# Patient Record
Sex: Female | Born: 1955
Health system: Southern US, Community
[De-identification: ages and names within clinical notes are randomized; demographics above are authoritative.]

## PROBLEM LIST (undated history)

## (undated) DIAGNOSIS — H409 Unspecified glaucoma: Secondary | ICD-10-CM

## (undated) DIAGNOSIS — F419 Anxiety disorder, unspecified: Secondary | ICD-10-CM

---

## 1998-04-29 ENCOUNTER — Other Ambulatory Visit: Admission: RE | Admit: 1998-04-29 | Discharge: 1998-04-29 | Payer: Self-pay | Admitting: Orthopedic Surgery

## 1999-09-29 ENCOUNTER — Other Ambulatory Visit: Admission: RE | Admit: 1999-09-29 | Discharge: 1999-09-29 | Payer: Self-pay | Admitting: Family Medicine

## 2000-09-26 ENCOUNTER — Other Ambulatory Visit: Admission: RE | Admit: 2000-09-26 | Discharge: 2000-09-26 | Payer: Self-pay | Admitting: Family Medicine

## 2001-12-22 ENCOUNTER — Other Ambulatory Visit: Admission: RE | Admit: 2001-12-22 | Discharge: 2001-12-22 | Payer: Self-pay | Admitting: Family Medicine

## 2003-01-05 ENCOUNTER — Other Ambulatory Visit: Admission: RE | Admit: 2003-01-05 | Discharge: 2003-01-05 | Payer: Self-pay | Admitting: Family Medicine

## 2004-02-04 ENCOUNTER — Other Ambulatory Visit: Admission: RE | Admit: 2004-02-04 | Discharge: 2004-02-04 | Payer: Self-pay | Admitting: Family Medicine

## 2005-02-09 ENCOUNTER — Other Ambulatory Visit: Admission: RE | Admit: 2005-02-09 | Discharge: 2005-02-09 | Payer: Self-pay | Admitting: Family Medicine

## 2013-08-14 ENCOUNTER — Other Ambulatory Visit: Payer: Self-pay | Admitting: Occupational Medicine

## 2013-08-14 ENCOUNTER — Ambulatory Visit: Payer: Self-pay

## 2013-08-14 DIAGNOSIS — R52 Pain, unspecified: Secondary | ICD-10-CM

## 2014-03-08 ENCOUNTER — Encounter (HOSPITAL_COMMUNITY): Payer: Self-pay | Admitting: Emergency Medicine

## 2014-03-08 ENCOUNTER — Emergency Department (HOSPITAL_COMMUNITY)
Admission: EM | Admit: 2014-03-08 | Discharge: 2014-03-08 | Disposition: A | Discharge status: Home / Self Care | Payer: Managed Care, Other (non HMO) | Attending: Emergency Medicine | Admitting: Emergency Medicine

## 2014-03-08 ENCOUNTER — Emergency Department (HOSPITAL_COMMUNITY): Payer: Managed Care, Other (non HMO)

## 2014-03-08 DIAGNOSIS — S1093XA Contusion of unspecified part of neck, initial encounter: Principal | ICD-10-CM

## 2014-03-08 DIAGNOSIS — S0083XA Contusion of other part of head, initial encounter: Principal | ICD-10-CM | POA: Insufficient documentation

## 2014-03-08 DIAGNOSIS — S0033XA Contusion of nose, initial encounter: Secondary | ICD-10-CM

## 2014-03-08 DIAGNOSIS — S0003XA Contusion of scalp, initial encounter: Secondary | ICD-10-CM | POA: Insufficient documentation

## 2014-03-08 DIAGNOSIS — Y92410 Unspecified street and highway as the place of occurrence of the external cause: Secondary | ICD-10-CM | POA: Insufficient documentation

## 2014-03-08 DIAGNOSIS — Y9389 Activity, other specified: Secondary | ICD-10-CM | POA: Insufficient documentation

## 2014-03-08 DIAGNOSIS — Z79899 Other long term (current) drug therapy: Secondary | ICD-10-CM | POA: Insufficient documentation

## 2014-03-08 DIAGNOSIS — S0990XA Unspecified injury of head, initial encounter: Secondary | ICD-10-CM | POA: Insufficient documentation

## 2014-03-08 DIAGNOSIS — S20219A Contusion of unspecified front wall of thorax, initial encounter: Secondary | ICD-10-CM | POA: Insufficient documentation

## 2014-03-08 MED ORDER — HYDROCODONE-ACETAMINOPHEN 5-325 MG PO TABS: ORAL_TABLET | ORAL | Status: DC

## 2014-03-08 MED ORDER — IBUPROFEN 200 MG PO TABS
600.0000 mg | ORAL_TABLET | Freq: Once | ORAL | Status: AC
  Administered 2014-03-08: 600 mg via ORAL
  Filled 2014-03-08: qty 3

## 2014-03-08 MED ORDER — METHOCARBAMOL 500 MG PO TABS: 1000.0000 mg | ORAL_TABLET | Freq: Four times a day (QID) | ORAL | Status: AC

## 2014-03-08 MED ORDER — IBUPROFEN 600 MG PO TABS: 600.0000 mg | ORAL_TABLET | Freq: Four times a day (QID) | ORAL | Status: DC | PRN
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
## 2014-07-08 IMAGING — CR DG KNEE COMPLETE 4+V*R*
4 series · 4 of 4 positions shown · non-contrast
Comparison: None.

CLINICAL DATA: Knee pain

EXAM:
RIGHT KNEE - COMPLETE 4+ VIEW

[view not recorded (1 of 4)]
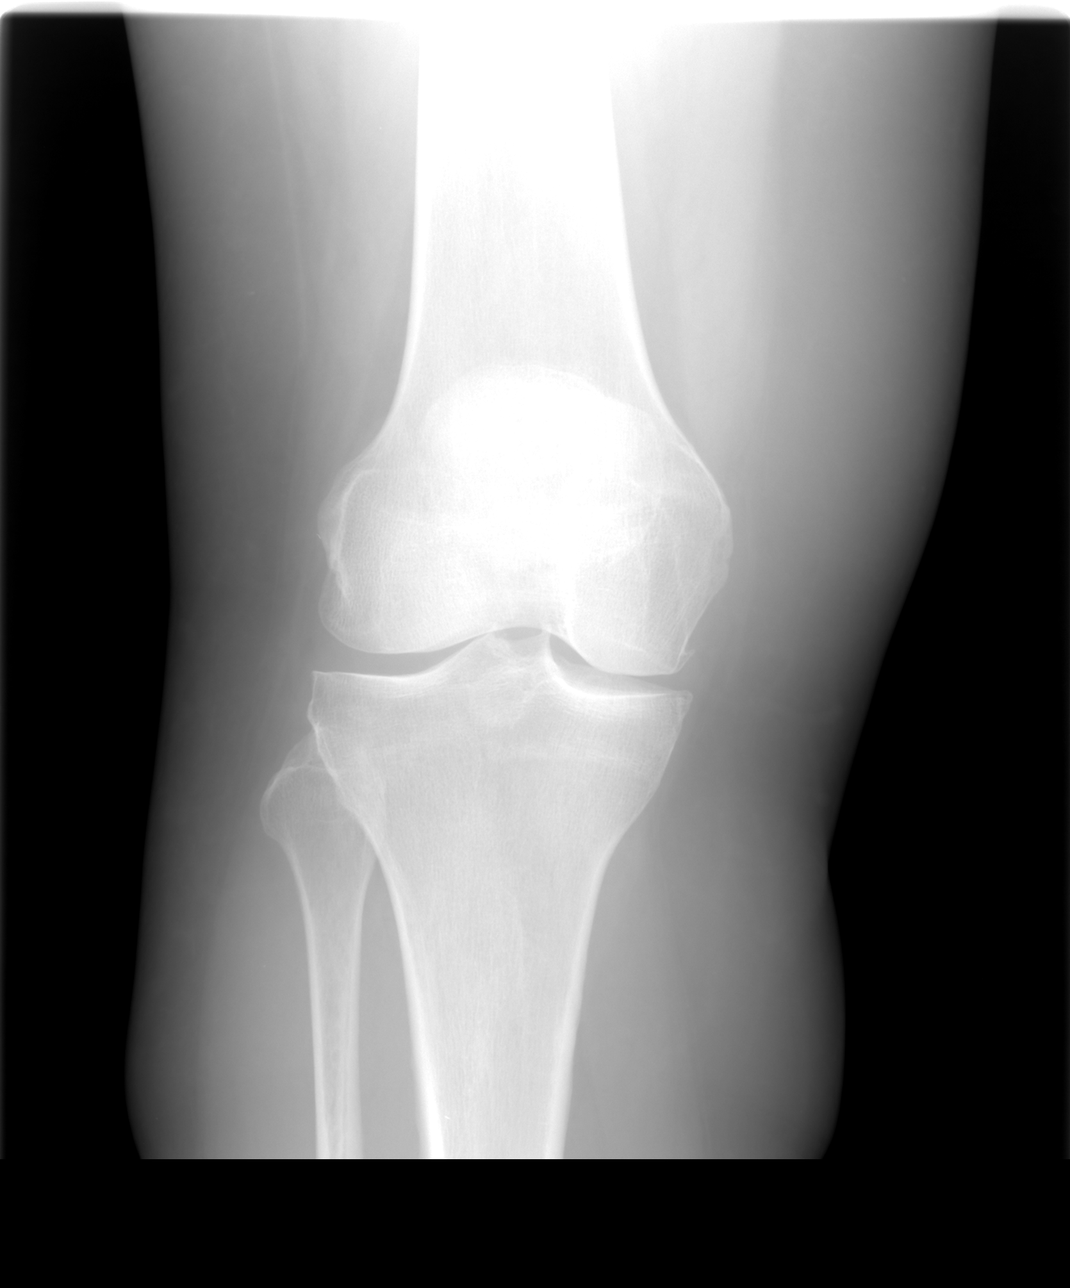

[view not recorded (2 of 4)]
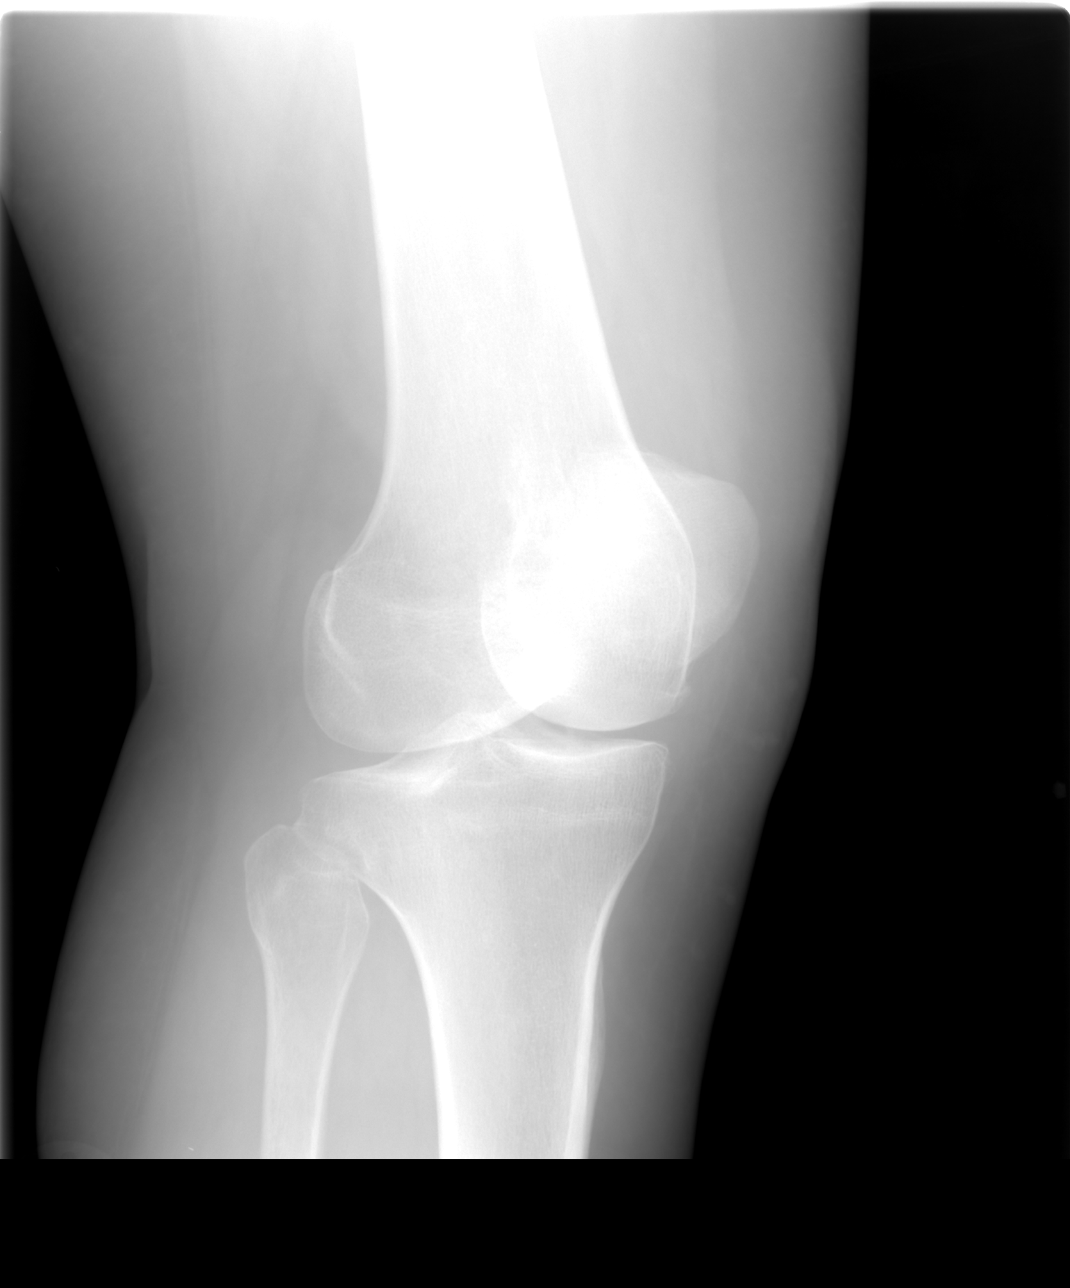

[view not recorded (3 of 4)]
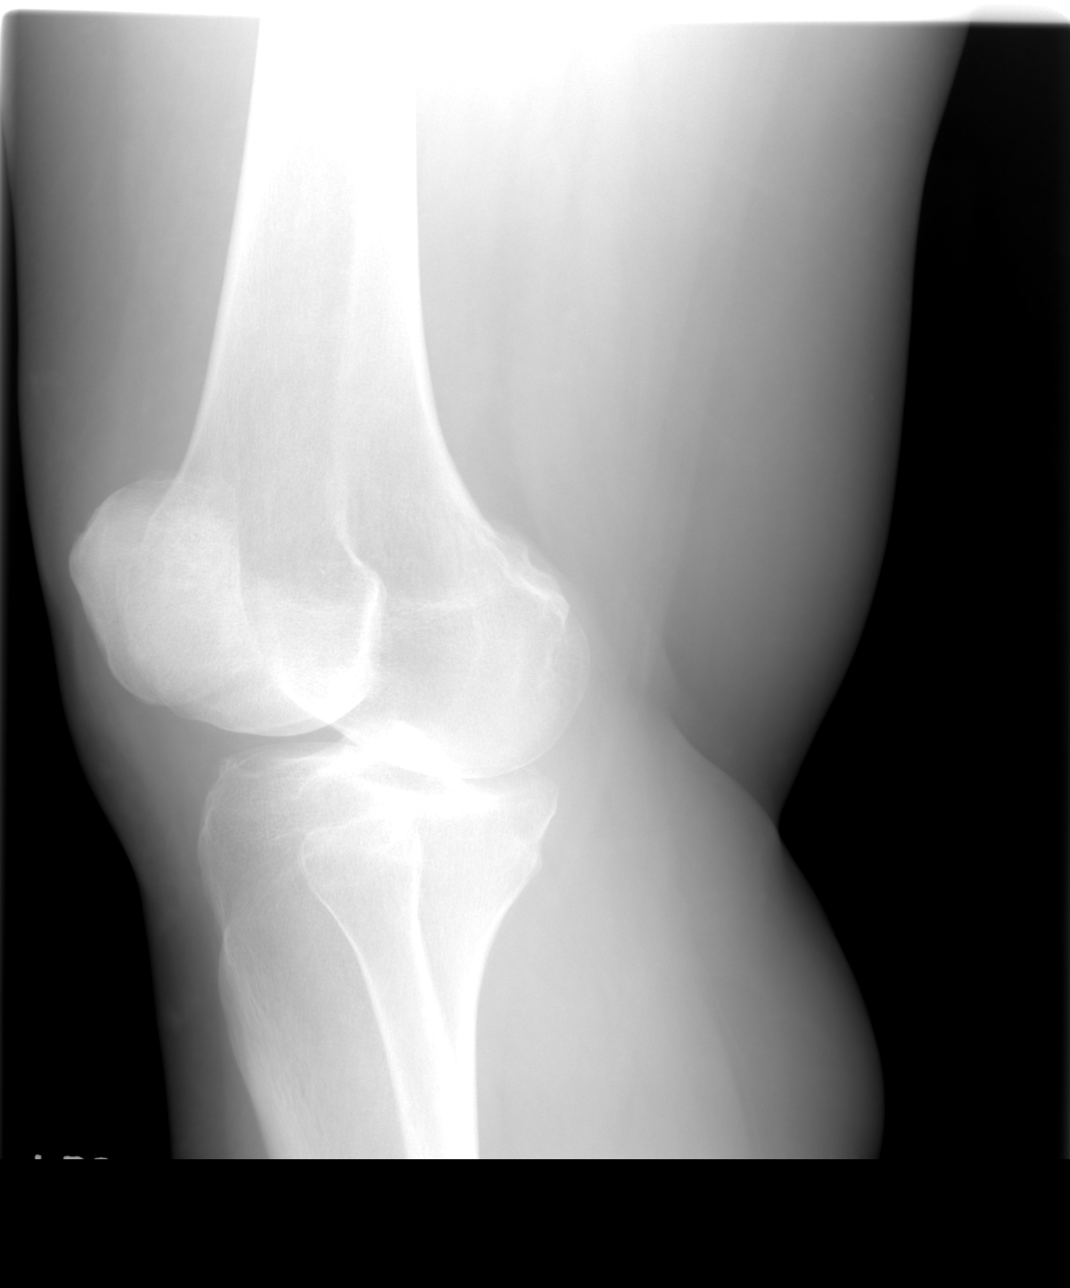

[view not recorded (4 of 4)]
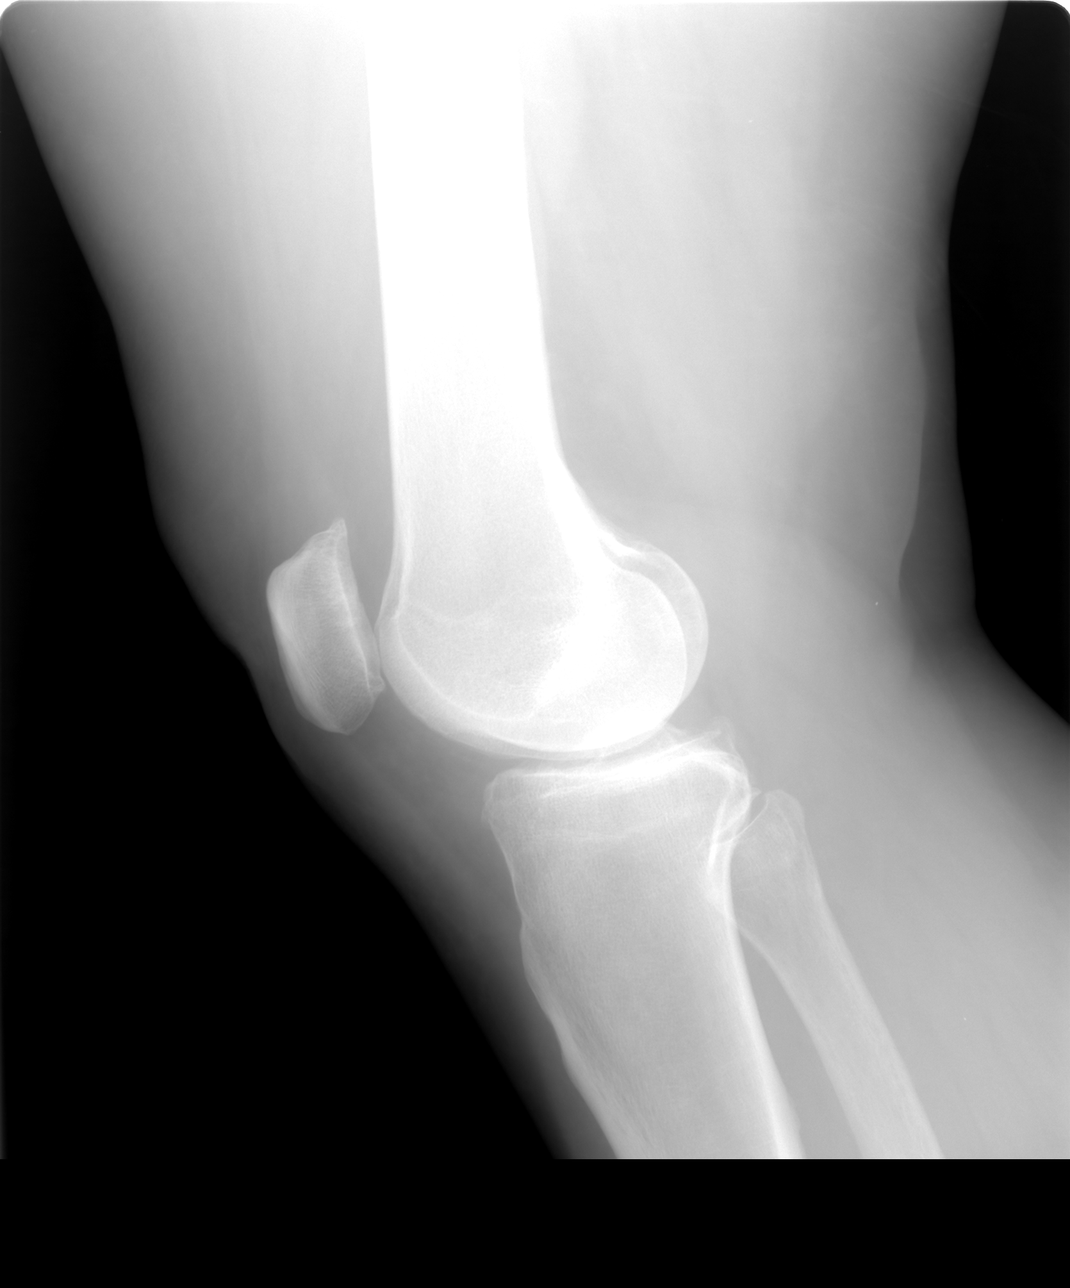

[4 of 4 positions shown; findings below may reference images not displayed]

FINDINGS: Mild degenerative changes are noted in the medial joint space with
spurring. No joint effusion is seen. No acute fracture or
dislocation is noted.
IMPRESSION: Degenerative change without acute abnormality.

## 2020-12-22 ENCOUNTER — Other Ambulatory Visit: Payer: Self-pay

## 2020-12-22 ENCOUNTER — Other Ambulatory Visit: Payer: Self-pay | Admitting: Podiatry

## 2020-12-22 ENCOUNTER — Ambulatory Visit (INDEPENDENT_AMBULATORY_CARE_PROVIDER_SITE_OTHER): Payer: Medicare Other

## 2020-12-22 ENCOUNTER — Ambulatory Visit (INDEPENDENT_AMBULATORY_CARE_PROVIDER_SITE_OTHER): Payer: Medicare Other | Admitting: Podiatry

## 2020-12-22 DIAGNOSIS — M21611 Bunion of right foot: Secondary | ICD-10-CM | POA: Diagnosis not present

## 2020-12-22 DIAGNOSIS — L989 Disorder of the skin and subcutaneous tissue, unspecified: Secondary | ICD-10-CM

## 2020-12-22 DIAGNOSIS — M79671 Pain in right foot: Secondary | ICD-10-CM

## 2020-12-22 DIAGNOSIS — I739 Peripheral vascular disease, unspecified: Secondary | ICD-10-CM

## 2020-12-22 DIAGNOSIS — M2041 Other hammer toe(s) (acquired), right foot: Secondary | ICD-10-CM

## 2020-12-22 DIAGNOSIS — M21961 Unspecified acquired deformity of right lower leg: Secondary | ICD-10-CM

## 2020-12-22 NOTE — Patient Instructions (Signed)

## 2020-12-24 NOTE — Progress Notes (Signed)
Subjective:   Patient ID: Jacqueline Ramirez, female   DOB: 65 y.o.   MRN: [id number]   HPI 65 year old female presents the office today for concerns of right foot pain.  She has a bunion and hammertoe which does cause discomfort and she also gets pain on the ball of her foot pointing to his second metatarsal phalangeal joint.  She denies any recent injury or trauma.  She does get some minimal swelling.  She does get also a skin lesion on the bottom of her foot.  Pain gets worse with activity.  No numbness or tingling.  She has tried changing shoes, offloading without any significant improvement.  States that her father lost his leg which seems to be due to arterial insufficiency.   Review of Systems  All other systems reviewed and are negative.  No past medical history on file.  No past surgical history on file.   Current Outpatient Medications:  .  estradiol-norethindrone (ACTIVELLA) 1-0.5 MG per tablet, Take 1 tablet by mouth daily. , Disp: , Rfl:  .  HYDROcodone-acetaminophen (NORCO/VICODIN) 5-325 MG per tablet, Take 1-2 tablets every 6 hours as needed for severe pain, Disp: 8 tablet, Rfl: 0 .  ibuprofen (ADVIL,MOTRIN) 600 MG tablet, Take 1 tablet (600 mg total) by mouth every 6 (six) hours as needed., Disp: 20 tablet, Rfl: 0 .  methocarbamol (ROBAXIN) 500 MG tablet, Take 2 tablets (1,000 mg total) by mouth 4 (four) times daily., Disp: 20 tablet, Rfl: 0  Allergies  Allergen Reactions  . Benadryl [Diphenhydramine Hcl (Sleep)]     Breaks out in hives         Objective:  Physical Exam  General: AAO x3, NAD  Dermatological: Skin is warm, dry and supple bilateral.  Small annular hyperkeratotic lesion submetatarsal 2.  No underlying ulceration drainage or signs of infection.  There are no open sores, no preulcerative lesions, no rash or signs of infection present.  Vascular: Dorsalis Pedis artery and Posterior Tibial artery pedal pulses are 2/4 bilateral with immedate capillary  fill time. There is no pain with calf compression, swelling, warmth, erythema.   Neruologic: Grossly intact via light touch bilateral.    Musculoskeletal: Moderate bunion is present.  No pain or crepitation with first MPJ range of motion.  There is no hypermobility of the first ray.  Hammertoe contracture present second digit.  Tenderness palpation second MPJ plantarly.  No other area discomfort.  Muscular strength 5/5 in all groups tested bilateral.  Gait: Unassisted, Nonantalgic.       Assessment:   65 year old female with right foot bunion, capsulitis, hammertoe    Plan:  -Treatment options discussed including all alternatives, risks, and complications -Etiology of symptoms were discussed -X-rays were obtained and reviewed with the patient.  Bunion is present with elongated second metatarsal. -We discussed both conservative as well as surgical treatment options.  After discussion she is tried shoe modifications, although without significant improvement she wants to go and proceed with surgery.  I discussed with her right foot surgical correction of bunion including likely first metatarsal osteotomy with Akin osteotomy possibly.  Discussed that the metatarsal shortening osteotomy with hammertoe repair as well as excision skin lesion.  She was going to proceed with this. -The incision placement as well as the postoperative course was discussed with the patient. I discussed risks of the surgery which include, but not limited to, infection, bleeding, pain, swelling, need for further surgery, delayed or nonhealing, painful or ugly scar, numbness or sensation changes,  over/under correction, recurrence, transfer lesions, further deformity, hardware failure, DVT/PE, loss of toe/foot. Patient understands these risks and wishes to proceed with surgery. The surgical consent was reviewed with the patient all 3 pages were signed. No promises or guarantees were given to the outcome of the procedure. All  questions were answered to the best of my ability. Before the surgery the patient was encouraged to call the office if there is any further questions. The surgery will be performed at the [facility] on an outpatient basis. -ABI ordered for preop evaluation given her family history -Cam boot dispensed.

## 2020-12-28 ENCOUNTER — Ambulatory Visit (HOSPITAL_COMMUNITY)
Admission: RE | Admit: 2020-12-28 | Discharge: 2020-12-28 | Disposition: A | Discharge status: Home / Self Care | Payer: Medicare Other | Source: Ambulatory Visit | Attending: Podiatry | Admitting: Podiatry

## 2020-12-28 ENCOUNTER — Other Ambulatory Visit: Payer: Self-pay

## 2020-12-28 DIAGNOSIS — I739 Peripheral vascular disease, unspecified: Secondary | ICD-10-CM | POA: Diagnosis present

## 2021-01-18 ENCOUNTER — Other Ambulatory Visit: Payer: Self-pay | Admitting: Podiatry

## 2021-01-18 ENCOUNTER — Encounter: Payer: Self-pay | Admitting: Podiatry

## 2021-01-18 DIAGNOSIS — M21611 Bunion of right foot: Secondary | ICD-10-CM | POA: Diagnosis not present

## 2021-01-18 DIAGNOSIS — M2041 Other hammer toe(s) (acquired), right foot: Secondary | ICD-10-CM | POA: Diagnosis not present

## 2021-01-18 DIAGNOSIS — M21961 Unspecified acquired deformity of right lower leg: Secondary | ICD-10-CM | POA: Diagnosis not present

## 2021-01-18 MED ORDER — PROMETHAZINE HCL 25 MG PO TABS
25.0000 mg | ORAL_TABLET | Freq: Three times a day (TID) | ORAL | 0 refills | Status: DC | PRN
Start: 2021-01-18 — End: 2021-01-18

## 2021-01-18 MED ORDER — OXYCODONE-ACETAMINOPHEN 5-325 MG PO TABS: 1.0000 | ORAL_TABLET | Freq: Four times a day (QID) | ORAL | 0 refills | Status: DC | PRN

## 2021-01-18 MED ORDER — CEPHALEXIN 500 MG PO CAPS: 500.0000 mg | ORAL_CAPSULE | Freq: Three times a day (TID) | ORAL | 0 refills | Status: DC

## 2021-01-18 MED ORDER — PROMETHAZINE HCL 25 MG PO TABS: 25.0000 mg | ORAL_TABLET | Freq: Three times a day (TID) | ORAL | 0 refills | Status: DC | PRN

## 2021-01-18 NOTE — Progress Notes (Signed)
Post-op medications 

## 2021-01-18 NOTE — Progress Notes (Signed)
Resent medications to correct pharmacy Called previous pharmacy and cancelled the prescriptions

## 2021-01-19 ENCOUNTER — Other Ambulatory Visit: Payer: Self-pay | Admitting: Podiatry

## 2021-01-19 ENCOUNTER — Telehealth: Payer: Self-pay | Admitting: *Deleted

## 2021-01-19 ENCOUNTER — Telehealth: Payer: Self-pay

## 2021-01-19 DIAGNOSIS — M21611 Bunion of right foot: Secondary | ICD-10-CM

## 2021-01-19 NOTE — Telephone Encounter (Signed)
Called and spoke with the patient today and stated that I was calling to see how patient was doing after having surgery with Dr Ardelle Anton and patient stated that she has some nausea this morning and I stated to eat some crackers and was icing and elevating and the patient stated that she felt like the toes were too tight and I stated could the patient wiggle her toes and patient stated yes and I stated to take the boot off and to take the sock and ace wrap off and to re-wrap with the ace wrap snuggly and put the sock back on and I stated to call the office if any concerns or questions. Misty Stanley

## 2021-01-19 NOTE — Telephone Encounter (Signed)
Spoke to Jacqueline Ramirez and informed her that Dr. Ardelle Anton placed an order for a knee scooter. I placed the order with [employer] and noticed [name] that someone from [employer] will be in contact with her today.

## 2021-01-24 ENCOUNTER — Other Ambulatory Visit: Payer: Self-pay

## 2021-01-24 ENCOUNTER — Ambulatory Visit (INDEPENDENT_AMBULATORY_CARE_PROVIDER_SITE_OTHER): Payer: Medicare Other

## 2021-01-24 ENCOUNTER — Ambulatory Visit (INDEPENDENT_AMBULATORY_CARE_PROVIDER_SITE_OTHER): Payer: Medicare Other | Admitting: Podiatry

## 2021-01-24 DIAGNOSIS — M21611 Bunion of right foot: Secondary | ICD-10-CM | POA: Diagnosis not present

## 2021-01-24 DIAGNOSIS — Z9889 Other specified postprocedural states: Secondary | ICD-10-CM

## 2021-01-24 DIAGNOSIS — M21961 Unspecified acquired deformity of right lower leg: Secondary | ICD-10-CM

## 2021-01-24 DIAGNOSIS — M2041 Other hammer toe(s) (acquired), right foot: Secondary | ICD-10-CM

## 2021-01-24 MED ORDER — OXYCODONE-ACETAMINOPHEN 5-325 MG PO TABS: 1.0000 | ORAL_TABLET | Freq: Four times a day (QID) | ORAL | 0 refills | Status: DC | PRN

## 2021-01-27 NOTE — Progress Notes (Signed)
Subjective: Jacqueline Ramirez is a 65 y.o. is seen today in office s/p Right foot first metatarsal osteotomy with plate, screw fixation as well as second metatarsal also with hammertoe repair preformed on 01/18/2021.  States that she is doing better still having some discomfort.  Has been in the cam boot and using crutches.  We ordered a knee scooter but she states that insurance was not covering this.  Denies any systemic complaints such as fevers, chills, nausea, vomiting. No calf pain, chest pain, shortness of breath.   Objective: General: No acute distress, AAOx3  DP/PT pulses palpable 2/4, CRT < 3 sec to all digits.  Protective sensation intact. Motor function intact.  Right foot: Incision is well coapted without any evidence of dehiscence with sutures intact.  Mild erythema to the second toe but this is likely more from inflammation as opposed to infection.  No drainage or pus or ascending cellulitis.  No warmth.  No malodor.  There is mild edema around the surgical site. There is mild pain along the surgical site.  No other areas of tenderness to bilateral lower extremities.  No other open lesions or pre-ulcerative lesions.  No pain with calf compression, swelling, warmth, erythema.   Assessment and Plan:  Status post right foot surgery, doing well with no complications   -Treatment options discussed including all alternatives, risks, and complications -X-rays obtained reviewed.  Hardware intact without any complicating factors. -Incision was clean.  Xeroform was applied followed by dry sterile dressing.  Keep dressing clean, dry, intact. -Remain partial weightbearing in cam boot. -Ice/elevation -Pain medication as needed. -Monitor for any clinical signs or symptoms of infection and DVT/PE and directed to call the office immediately should any occur or go to the ER. -Follow-up as scheduled for possible suture removal or sooner if any problems arise. In the meantime, encouraged to call  the office with any questions, concerns, change in symptoms.   Ovid Curd, DPM

## 2021-02-02 ENCOUNTER — Other Ambulatory Visit: Payer: Self-pay

## 2021-02-02 ENCOUNTER — Ambulatory Visit (INDEPENDENT_AMBULATORY_CARE_PROVIDER_SITE_OTHER): Payer: Medicare Other | Admitting: Podiatry

## 2021-02-02 DIAGNOSIS — M21961 Unspecified acquired deformity of right lower leg: Secondary | ICD-10-CM

## 2021-02-02 DIAGNOSIS — Z9889 Other specified postprocedural states: Secondary | ICD-10-CM

## 2021-02-02 DIAGNOSIS — M21611 Bunion of right foot: Secondary | ICD-10-CM

## 2021-02-08 NOTE — Progress Notes (Signed)
Subjective: Jacqueline Ramirez is a 65 y.o. is seen today in office s/p Right foot first metatarsal osteotomy with plate, screw fixation as well as second metatarsal also with hammertoe repair preformed on 01/18/2021.  She does report that her pain is improving and doing better.  She states that she still has some swelling.  No recent injury or falls or changes otherwise that she reports. Denies any systemic complaints such as fevers, chills, nausea, vomiting. No calf pain, chest pain, shortness of breath.   Objective: General: No acute distress, AAOx3  DP/PT pulses palpable 2/4, CRT < 3 sec to all digits.  Protective sensation intact. Motor function intact.  Right foot: Incision is well coapted without any evidence of dehiscence with sutures intact.  Erythema that is present the second toe has resolved.  There is no drainage or pus coming the incision sites.  There is no pain with MPJ range of motion of the first.  K wire intact the second toe without any drainage or signs of infection. No other open lesions or pre-ulcerative lesions.  No pain with calf compression, swelling, warmth, erythema.   Assessment and Plan:  Status post right foot surgery, doing well with no complications   -Treatment options discussed including all alternatives, risks, and complications -I removed half the sutures today.  Incision remained well coapted but due to still some residual swelling I held off removing the remainder.  Antibiotic ointment and dressing applied.  Keep dressing clean, dry, intact until next week and then I will likely remove the remainder the sutures.  Continue in cam boot and she can weight-bear as tolerated at this time.  Return in about 1 week (around 02/09/2021).  Vivi Barrack DPM

## 2021-02-10 ENCOUNTER — Ambulatory Visit (INDEPENDENT_AMBULATORY_CARE_PROVIDER_SITE_OTHER): Payer: Medicare Other | Admitting: Podiatry

## 2021-02-10 ENCOUNTER — Ambulatory Visit (INDEPENDENT_AMBULATORY_CARE_PROVIDER_SITE_OTHER): Payer: Medicare Other

## 2021-02-10 ENCOUNTER — Other Ambulatory Visit: Payer: Self-pay

## 2021-02-10 VITALS — Temp 98.8°F

## 2021-02-10 DIAGNOSIS — M21611 Bunion of right foot: Secondary | ICD-10-CM

## 2021-02-10 DIAGNOSIS — Z9889 Other specified postprocedural states: Secondary | ICD-10-CM | POA: Diagnosis not present

## 2021-02-10 DIAGNOSIS — M2041 Other hammer toe(s) (acquired), right foot: Secondary | ICD-10-CM

## 2021-02-10 NOTE — Progress Notes (Signed)
Subjective: Jacqueline Ramirez is a 65 y.o. is seen today in office s/p Right foot first metatarsal osteotomy with plate, screw fixation as well as second metatarsal also with hammertoe repair preformed on 01/18/2021.  States that she is doing well not having any pain.  She presents today for the remainder the sutures removed.  She has no other concerns today.  Has been walking in the cam boot.  Denies any fevers, chills, nausea, vomiting.  No calf pain, chest pain, shortness of breath.    Objective: General: No acute distress, AAOx3  DP/PT pulses palpable 2/4, CRT < 3 sec to all digits.  Protective sensation intact. Motor function intact.  Right foot: Incision is well coapted without any evidence of dehiscence with sutures intact.  There is no surrounding erythema, ascending cellulitis there is no drainage or pus.  No fluctuance or crepitation.  The toes are in rectus position.  No pain with first digit range of motion. No other open lesions or pre-ulcerative lesions.  No pain with calf compression, swelling, warmth, erythema.   Assessment and Plan:  Status post right foot surgery, doing well with no complications   -Treatment options discussed including all alternatives, risks, and complications -Repeat x-rays obtained reviewed.  There is some bone callus formation present on the first metatarsal osteotomy.  Hardware intact without any complicating factors. -Sutures were removed today without complications.  Antibiotic ointment was applied followed by dressing.  She wants to go with soap and water and apply a similar antibiotic ointment bandage.  Bandages were dispensed today.  Continue cam boot, limit weightbearing.  Ice and elevation.   Vivi Barrack DPM

## 2021-02-16 ENCOUNTER — Encounter: Payer: Medicare Other | Admitting: Podiatry

## 2021-02-28 ENCOUNTER — Other Ambulatory Visit: Payer: Self-pay

## 2021-02-28 ENCOUNTER — Encounter: Payer: Self-pay | Admitting: Podiatry

## 2021-02-28 ENCOUNTER — Ambulatory Visit (INDEPENDENT_AMBULATORY_CARE_PROVIDER_SITE_OTHER): Payer: Medicare Other | Admitting: Podiatry

## 2021-02-28 ENCOUNTER — Ambulatory Visit (INDEPENDENT_AMBULATORY_CARE_PROVIDER_SITE_OTHER): Payer: Medicare Other

## 2021-02-28 DIAGNOSIS — M21611 Bunion of right foot: Secondary | ICD-10-CM

## 2021-02-28 DIAGNOSIS — Z9889 Other specified postprocedural states: Secondary | ICD-10-CM

## 2021-02-28 DIAGNOSIS — M2041 Other hammer toe(s) (acquired), right foot: Secondary | ICD-10-CM

## 2021-03-03 NOTE — Progress Notes (Signed)
Subjective: Jacqueline Ramirez is a 65 y.o. is seen today in office s/p Right foot first metatarsal osteotomy with plate, screw fixation as well as second metatarsal also with hammertoe repair preformed on 01/18/2021.  She presents today for K wire removed from the second toe.  She is nervous about this but otherwise she states that she is doing well in significant discomfort.  She is been walking in the cam boot but still using crutches to avoid putting a full weight on the foot.  Denies any fevers, chills, nausea, vomiting.  No other concerns today.    Objective: General: No acute distress, AAOx3  DP/PT pulses palpable 2/4, CRT < 3 sec to all digits.  Protective sensation intact. Motor function intact.  Right foot: Incision is well coapted without any evidence of dehiscence with K wire across the second toe.  There is no drainage or any signs of infection of the toe, K wire site.  The toes are in rectus position.  No pain with MPJ range of motion.  No significant discomfort the surgical site.  There is mild edema there is no erythema or warmth. No other open lesions or pre-ulcerative lesions.  No pain with calf compression, swelling, warmth, erythema.   Assessment and Plan:  Status post right foot surgery, doing well with no complications   -Treatment options discussed including all alternatives, risks, and complications -X-rays obtained reviewed. There is increased bone callus formation on the first metatarsal.  Hardware intact without any complicating factors. -K wire was cleaned with alcohol and this was removed in toto without complications and she tolerated well. -Continue ambulation in cam boot.  Continue ice and elevation.  Return in about 2 weeks (around 03/14/2021) for post-op check, x-ray.  Vivi Barrack DPM

## 2021-03-14 ENCOUNTER — Ambulatory Visit (INDEPENDENT_AMBULATORY_CARE_PROVIDER_SITE_OTHER): Payer: Medicare Other

## 2021-03-14 ENCOUNTER — Other Ambulatory Visit: Payer: Self-pay

## 2021-03-14 ENCOUNTER — Ambulatory Visit (INDEPENDENT_AMBULATORY_CARE_PROVIDER_SITE_OTHER): Payer: Medicare Other | Admitting: Podiatry

## 2021-03-14 ENCOUNTER — Encounter: Payer: Self-pay | Admitting: Podiatry

## 2021-03-14 DIAGNOSIS — Z9889 Other specified postprocedural states: Secondary | ICD-10-CM

## 2021-03-14 DIAGNOSIS — M2041 Other hammer toe(s) (acquired), right foot: Secondary | ICD-10-CM | POA: Diagnosis not present

## 2021-03-14 DIAGNOSIS — M21611 Bunion of right foot: Secondary | ICD-10-CM

## 2021-03-18 NOTE — Progress Notes (Signed)
Subjective: Jacqueline Ramirez is a 65 y.o. is seen today in office s/p Right foot first metatarsal osteotomy with plate, screw fixation as well as second metatarsal also with hammertoe repair preformed on 01/18/2021.  She has not been having any significant pain in that knee pain medication.  She is still in the cam boot.  She says the toes feel stiff.  No recent injury or changes otherwise.  No fevers or chills.  No other concerns.   Objective: General: No acute distress, AAOx3  DP/PT pulses palpable 2/4, CRT < 3 sec to all digits.  Protective sensation intact. Motor function intact.  Right foot: Incision is well coapted without any evidence of dehiscence and scar is well formed.  Minimal edema.  No erythema warmth the second toe does sit slightly dorsiflexed on the scar swelling of the third toe started under the second.  Appears to be mild at this point.  No pain. No other open lesions or pre-ulcerative lesions.  No pain with calf compression, swelling, warmth, erythema.   Assessment and Plan:  Status post right foot surgery, doing well with no complications   -Treatment options discussed including all alternatives, risks, and complications -X-rays obtained reviewed. There is increased bone callus formation on the first metatarsal.   -We will start physical therapy.  Order was written for [facility].  -Splint applied to help hold the second toe down.  As she continues to improve she can start taking addition to regular shoe as tolerated.  Vivi Barrack DPM

## 2021-03-20 ENCOUNTER — Telehealth: Payer: Self-pay | Admitting: Podiatry

## 2021-03-20 ENCOUNTER — Other Ambulatory Visit: Payer: Self-pay | Admitting: Podiatry

## 2021-03-20 DIAGNOSIS — M21611 Bunion of right foot: Secondary | ICD-10-CM

## 2021-03-20 NOTE — Telephone Encounter (Signed)
Patient was calling to follow up on referral for [facility].  Phone: [phone] Fax:      [phone]

## 2021-03-21 NOTE — Telephone Encounter (Signed)
Faxed the referral to [facility] [place] today. Misty Stanley

## 2021-03-21 NOTE — Telephone Encounter (Signed)
Called and spoke with patient today and I stated that I sent over the referral to PT in [place]. Misty Stanley

## 2021-04-10 ENCOUNTER — Encounter: Payer: Self-pay | Admitting: Podiatry

## 2021-04-10 ENCOUNTER — Ambulatory Visit (INDEPENDENT_AMBULATORY_CARE_PROVIDER_SITE_OTHER): Payer: Medicare Other

## 2021-04-10 ENCOUNTER — Other Ambulatory Visit: Payer: Self-pay

## 2021-04-10 ENCOUNTER — Ambulatory Visit (INDEPENDENT_AMBULATORY_CARE_PROVIDER_SITE_OTHER): Payer: Medicare Other | Admitting: Podiatry

## 2021-04-10 DIAGNOSIS — M2041 Other hammer toe(s) (acquired), right foot: Secondary | ICD-10-CM | POA: Diagnosis not present

## 2021-04-12 NOTE — Progress Notes (Signed)
Subjective: Jacqueline Ramirez is a 65 y.o. is seen today in office s/p Right foot first metatarsal osteotomy with plate, screw fixation as well as second metatarsal also with hammertoe repair preformed on 01/18/2021.  She states that she not having pain and she states that she gets along well.  No concerns other than she is concerned about the position of her toes.  She is been wearing the Darco shoe on the second toe to help hold it down and she states it may be shifting to the side.  Objective: General: No acute distress, AAOx3  DP/PT pulses palpable 2/4, CRT < 3 sec to all digits.  Protective sensation intact. Motor function intact.  Right foot: Incision is well coapted without any evidence of dehiscence and scar is well formed.  Slight edema present there is no erythema or warmth.  The hallux is in rectus position.  Second toe does have slight transverse plane deformity but it is coming more from where the third toe was sitting underneath the second toe which I reviewed with her.  The third toe simply pushing the second toe up. No other open lesions or pre-ulcerative lesions.  No pain with calf compression, swelling, warmth, erythema.   Assessment and Plan:  Status post right foot surgery, doing well with no complications   -Treatment options discussed including all alternatives, risks, and complications -X-rays obtained reviewed. There is increased bone callus formation on the first metatarsal.  Lateral deviation of all of the DIPJ of the second toe with the third toe is also rotated. -At this point she not having pain.  Discussed with her continue range of motion exercises and dispensed various offloading to help hold the second toe down.  As she not having significant pain at hold off any revision surgery at this point however long-term if needed will need to have the third toe corrected if become symptomatic and possible revision of the second toe.  Hopefully we can continue with the  splinting and range of motion.   Vivi Barrack DPM

## 2021-05-08 ENCOUNTER — Other Ambulatory Visit: Payer: Self-pay

## 2021-05-08 ENCOUNTER — Ambulatory Visit (INDEPENDENT_AMBULATORY_CARE_PROVIDER_SITE_OTHER): Payer: Medicare Other | Admitting: Podiatry

## 2021-05-08 ENCOUNTER — Ambulatory Visit (INDEPENDENT_AMBULATORY_CARE_PROVIDER_SITE_OTHER): Payer: Medicare Other

## 2021-05-08 ENCOUNTER — Encounter: Payer: Self-pay | Admitting: Podiatry

## 2021-05-08 DIAGNOSIS — M2041 Other hammer toe(s) (acquired), right foot: Secondary | ICD-10-CM

## 2021-05-08 DIAGNOSIS — M21611 Bunion of right foot: Secondary | ICD-10-CM

## 2021-05-08 DIAGNOSIS — Z9889 Other specified postprocedural states: Secondary | ICD-10-CM

## 2021-05-10 NOTE — Progress Notes (Signed)
Subjective: Jacqueline Ramirez is a 65 y.o. is seen today in office s/p Right foot first metatarsal osteotomy with plate, screw fixation as well as second metatarsal also with hammertoe repair preformed on 01/18/2021.  She is been doing physical therapy but she has been progressing well she denies any pain.  Her only concern is the third toe sits underneath the second toe causing some occasional irritation with certain shoes.  No recent injury.  No other concerns.    Objective: General: No acute distress, AAOx3  DP/PT pulses palpable 2/4, CRT < 3 sec to all digits.  Protective sensation intact. Motor function intact.  Right foot: Incision is well coapted without any evidence of dehiscence and scar is well formed.  There is no significant tenderness palpation on surgical site.  The second toe does sit dorsiflex his left third toe does rotate underneath it.  It is reducible.  There is no significant palpation on exam. No pain with calf compression, swelling, warmth, erythema.   Assessment and Plan:  Status post right foot surgery, third toe deformity  -Treatment options discussed including all alternatives, risks, and complications -At this point discussed with her revision surgery to help with the third toe to allow the leg not to sit under the second.  She was ordered for any further surgical intervention at this time she is to have upcoming knee surgery most likely.  Discussed different splints the morning is now the third toe to the fourth toe to see if that will be beneficial to avoid pressure.  Discussed changing shoes to avoid pressure.  At this point she is not interested in any further surgical treatment we will continue with conservative care and I will see her back as needed but encouraged to call with any questions or changes.  Vivi Barrack DPM

## 2023-10-21 ENCOUNTER — Ambulatory Visit (HOSPITAL_BASED_OUTPATIENT_CLINIC_OR_DEPARTMENT_OTHER)
Admit: 2023-10-21 | Discharge: 2023-10-21 | Disposition: A | Discharge status: Home / Self Care | Payer: Medicare Other | Attending: Family Medicine | Admitting: Radiology

## 2023-10-21 ENCOUNTER — Encounter (HOSPITAL_BASED_OUTPATIENT_CLINIC_OR_DEPARTMENT_OTHER): Payer: Self-pay

## 2023-10-21 ENCOUNTER — Ambulatory Visit (HOSPITAL_BASED_OUTPATIENT_CLINIC_OR_DEPARTMENT_OTHER): Payer: Medicare Other | Admitting: Radiology

## 2023-10-21 ENCOUNTER — Ambulatory Visit (HOSPITAL_BASED_OUTPATIENT_CLINIC_OR_DEPARTMENT_OTHER)
Admission: EM | Admit: 2023-10-21 | Discharge: 2023-10-21 | Disposition: A | Discharge status: Home / Self Care | Payer: Medicare Other

## 2023-10-21 DIAGNOSIS — S0502XA Injury of conjunctiva and corneal abrasion without foreign body, left eye, initial encounter: Secondary | ICD-10-CM | POA: Diagnosis not present

## 2023-10-21 DIAGNOSIS — R220 Localized swelling, mass and lump, head: Secondary | ICD-10-CM | POA: Diagnosis not present

## 2023-10-21 DIAGNOSIS — S0081XA Abrasion of other part of head, initial encounter: Secondary | ICD-10-CM

## 2023-10-21 DIAGNOSIS — W2209XA Striking against other stationary object, initial encounter: Secondary | ICD-10-CM

## 2023-10-21 DIAGNOSIS — R519 Headache, unspecified: Secondary | ICD-10-CM

## 2023-10-21 DIAGNOSIS — M25531 Pain in right wrist: Secondary | ICD-10-CM | POA: Diagnosis not present

## 2023-10-21 DIAGNOSIS — S60811A Abrasion of right wrist, initial encounter: Secondary | ICD-10-CM

## 2023-10-21 DIAGNOSIS — W010XXA Fall on same level from slipping, tripping and stumbling without subsequent striking against object, initial encounter: Secondary | ICD-10-CM

## 2023-10-21 DIAGNOSIS — W1831XA Fall on same level due to stepping on an object, initial encounter: Secondary | ICD-10-CM | POA: Diagnosis not present

## 2023-10-21 HISTORY — DX: Anxiety disorder, unspecified: F41.9

## 2023-10-21 HISTORY — DX: Unspecified glaucoma: H40.9

## 2023-10-21 MED ORDER — ACETAMINOPHEN 325 MG PO TABS
650.0000 mg | ORAL_TABLET | Freq: Once | ORAL | Status: AC
  Administered 2023-10-21: 650 mg via ORAL

## 2023-10-21 MED ORDER — ERYTHROMYCIN 5 MG/GM OP OINT: TOPICAL_OINTMENT | OPHTHALMIC | 0 refills | Status: AC

## 2023-10-21 NOTE — ED Provider Notes (Signed)
 [facility]    CSN: [id number] Arrival date & time: 10/21/23  0806      History   Chief Complaint Chief Complaint  Patient presents with   Eye Injury    HPI Jacqueline Ramirez is a 68 y.o. female.   The patient was walking in garden yesterday, she tripped on a rock. The left side of her head struck a fence, causing injury to left eye and face.  She has bruising and abrasions around her left eye, left forehead and left cheek. No loss of consciousness. Patient reports vision is good. Visual acuity performed: Left eye 20/25, Right eye 20/25 uncorrected.    Eye Injury Pertinent negatives include no chest pain, no abdominal pain and no shortness of breath.    Past Medical History:  Diagnosis Date   Anxiety    Glaucoma     There are no active problems to display for this patient.   History reviewed. No pertinent surgical history.  OB History   No obstetric history on file.      Home Medications    Prior to Admission medications   Medication Sig Start Date End Date Taking? Authorizing Provider  erythromycin ophthalmic ointment Place a 1/4 inch ribbon of ointment into the lower eyelid twice daily x 5-7 days 10/21/23  Yes Prescilla Sours, FNP  escitalopram (LEXAPRO) 10 MG tablet Take 10 mg by mouth daily.   Yes [provider]  latanoprost (XALATAN) 0.005 % ophthalmic solution Place 1 drop into both eyes at bedtime.   Yes [provider]  methocarbamol (ROBAXIN) 500 MG tablet Take 2 tablets (1,000 mg total) by mouth 4 (four) times daily. 03/08/14   Renne Crigler, PA-C    Family History History reviewed. No pertinent family history.  Social History Social History   Tobacco Use   Smoking status: Never  Substance Use Topics   Alcohol use: No   Drug use: No     Allergies   Benadryl [diphenhydramine hcl (sleep)]   Review of Systems Review of Systems  Constitutional:  Negative for chills and fever.  HENT:  Negative for ear pain and  sore throat.   Eyes:  Positive for pain and redness. Negative for visual disturbance.  Respiratory:  Negative for cough and shortness of breath.   Cardiovascular:  Negative for chest pain and palpitations.  Gastrointestinal:  Negative for abdominal pain, constipation, diarrhea, nausea and vomiting.  Genitourinary:  Negative for dysuria and hematuria.  Musculoskeletal:  Negative for arthralgias and back pain.       Left facial pain and swelling in the forehead and cheek  Skin:  Positive for wound (abrasions to left forehead and left cheek). Negative for color change and rash.  Neurological:  Negative for seizures and syncope.  All other systems reviewed and are negative.    Physical Exam Triage Vital Signs ED Triage Vitals  Encounter Vitals Group     BP 10/21/23 0824 139/82     Systolic BP Percentile --      Diastolic BP Percentile --      Pulse Rate 10/21/23 0824 76     Resp 10/21/23 0824 20     Temp 10/21/23 0824 98.1 F (36.7 C)     Temp Source 10/21/23 0824 Oral     SpO2 10/21/23 0824 97 %     Weight --      Height --      Head Circumference --      Peak Flow --  Pain Score 10/21/23 0826 2     Pain Loc --      Pain Education --      Exclude from Growth Chart --    No data found.  Updated Vital Signs BP 139/82 (BP Location: Right Arm)   Pulse 76   Temp 98.1 F (36.7 C) (Oral)   Resp 20   SpO2 97%   Visual Acuity Right Eye Distance: 20/25 Left Eye Distance: 20/25 Bilateral Distance: 20/25  Right Eye Near:   Left Eye Near:    Bilateral Near:     Physical Exam Vitals and nursing note reviewed.  Constitutional:      General: She is not in acute distress.    Appearance: She is well-developed. She is not ill-appearing or toxic-appearing.  HENT:     Head: Normocephalic. Abrasion (Left forehead and left cheek) and contusion (Left eye: upper and lower lid with ecchymosis.) present.     Right Ear: Hearing, tympanic membrane, ear canal and external ear normal.      Left Ear: Hearing, tympanic membrane, ear canal and external ear normal.     Nose: No congestion or rhinorrhea.     Right Sinus: No maxillary sinus tenderness or frontal sinus tenderness.     Left Sinus: No maxillary sinus tenderness or frontal sinus tenderness.     Mouth/Throat:     Lips: Pink.     Mouth: Mucous membranes are moist.     Pharynx: Uvula midline. No oropharyngeal exudate or posterior oropharyngeal erythema.     Tonsils: No tonsillar exudate.  Eyes:     General: No visual field deficit.       Right eye: No foreign body, discharge or hordeolum.        Left eye: No foreign body, discharge or hordeolum.     Extraocular Movements: Extraocular movements intact.     Conjunctiva/sclera: Conjunctivae normal.     Right eye: Right conjunctiva is not injected. No chemosis, exudate or hemorrhage.    Left eye: Left conjunctiva is not injected. Chemosis present. No exudate or hemorrhage.    Pupils: Pupils are equal, round, and reactive to light.     Left eye: Corneal abrasion and fluorescein uptake present.     Comments: Bilateral pupils are constricted but they do dilate and constrict with exposure to light.  Funduscopic exam is normal but hard to perform due to the constriction of the pupils.  Cardiovascular:     Rate and Rhythm: Normal rate and regular rhythm.     Heart sounds: S1 normal and S2 normal. No murmur heard. Pulmonary:     Effort: Pulmonary effort is normal. No respiratory distress.     Breath sounds: Normal breath sounds. No decreased breath sounds, wheezing, rhonchi or rales.  Abdominal:     General: Bowel sounds are normal.     Palpations: Abdomen is soft.     Tenderness: There is no abdominal tenderness.  Musculoskeletal:        General: No swelling.     Right wrist: Swelling, tenderness and bony tenderness present. No snuff box tenderness. Decreased range of motion (Decreased flexion, extension, lateral and medial.).     Left wrist: Normal.     Cervical back:  Neck supple.  Lymphadenopathy:     Head:     Right side of head: No submental, submandibular, tonsillar, preauricular or posterior auricular adenopathy.     Left side of head: No submental, submandibular, tonsillar, preauricular or posterior auricular adenopathy.  Cervical: No cervical adenopathy.     Right cervical: No superficial cervical adenopathy.    Left cervical: No superficial cervical adenopathy.  Skin:    General: Skin is warm and dry.     Capillary Refill: Capillary refill takes less than 2 seconds.     Findings: Abrasion (Left forehead, left cheek, right breast.  No erythema, exudate.  Thumb edema at all sites.) present. No rash.  Neurological:     Mental Status: She is alert and oriented to person, place, and time.  Psychiatric:        Mood and Affect: Mood normal.      UC Treatments / Results  Labs (all labs ordered are listed, but only abnormal results are displayed) Labs Reviewed - No data to display  EKG   Radiology DG Wrist Complete Right Result Date: 10/21/2023 CLINICAL DATA:  Right wrist pain EXAM: RIGHT WRIST - COMPLETE 3+ VIEW COMPARISON:  03/12/2014 FINDINGS: Subtle bony irregularity along the dorsal margin of the proximal carpal row seen on lateral view may represent a small triquetral fracture. Carpal bones appear otherwise intact. No malalignment. Minimal degenerative changes at the first [facility] joint. Mild soft tissue swelling. IMPRESSION: Subtle bony irregularity along the dorsal margin of the wrist may represent a small triquetral fracture. Correlate with point tenderness. Electronically Signed   By: Duanne Guess D.O.   On: 10/21/2023 09:53   DG Facial Bones Complete Result Date: 10/21/2023 CLINICAL DATA:  Fall, left facial pain EXAM: FACIAL BONES COMPLETE 3+V COMPARISON:  03/12/2014 FINDINGS: There is no evidence of fracture or other significant bone abnormality. No orbital emphysema or sinus air-fluid levels are seen. IMPRESSION: Negative.  Electronically Signed   By: Duanne Guess D.O.   On: 10/21/2023 09:50    Procedures Procedures (including critical care time)  Medications Ordered in UC Medications  acetaminophen (TYLENOL) tablet 650 mg (650 mg Oral Given 10/21/23 0906)    Initial Impression / Assessment and Plan / UC Course  I have reviewed the triage vital signs and the nursing notes.  Pertinent labs & imaging results that were available during my care of the patient were reviewed by me and considered in my medical decision making (see chart for details).     X-rays appear negative.  Will update patient once radiology has reviewed the films.  Encouraged RICE therapy and elastic wrist splint from the pharmacy.  Encouraged acetaminophen or Tylenol, 650 mg, every 6-8 hours as needed for pain.  Discouraged use to NSAIDs due to risk of increased bruising.  Educated about corneal abrasion.  Erythromycin ointment, a fourth of an inch ribbon, into the left eye, twice daily for 5 to 7 days.  Follow-up with ophthalmology if eye pain does not significantly improve in 1 to 3 days.  Follow-up here if symptoms do not improve, worsen or new symptoms occur. Final Clinical Impressions(s) / UC Diagnoses   Final diagnoses:  Left facial swelling  Left facial pain  Right wrist pain  Abrasion, face w/o infection  Abrasion of right wrist, initial encounter  Fall on same level from slipping, tripping or stumbling, initial encounter  Abrasion of left cornea, initial encounter     Discharge Instructions      Facial films and right wrist film appear negative.  Patient advised that radiology will review the films and I will contact her about the results of the radiology review later today or tomorrow.  Encouraged to use acetaminophen, 650 mg, every 6-8 hours as needed for pain.  Many NSAIDs have some aspirin like properties and might increase the bruising so I discouraged use of NSAIDs.  Encouraged RICE therapy with handout  given.  Encouraged soft, elastic wrist support from pharmacy.  Educated about corneal abrasion.  Erythromycin ointment, fourth of an inch, twice daily for 5 to 7 days.  Follow-up with ophthalmology if eye pain persists.  Follow-up here if symptoms do not improve, worsen or new symptoms occur.     ED Prescriptions     Medication Sig Dispense Auth. Provider   erythromycin ophthalmic ointment Place a 1/4 inch ribbon of ointment into the lower eyelid twice daily x 5-7 days 3.5 g Prescilla Sours, FNP      PDMP not reviewed this encounter.   Prescilla Sours, FNP 10/21/23 [phone]

## 2023-10-21 NOTE — ED Triage Notes (Signed)
 Walking in garden yesterday, tripped on a rock. Left side of head struck fence causing injury to left eye. Discoloration, abrasion noted around left eye. No LOC. Patient reports vision is good. Visual acuity performed. Left eye 20/25, Right eye 20/25  uncorrected.

## 2023-10-21 NOTE — Discharge Instructions (Addendum)
 Facial films and right wrist film appear negative.  Patient advised that radiology will review the films and I will contact her about the results of the radiology review later today or tomorrow.  Encouraged to use acetaminophen, 650 mg, every 6-8 hours as needed for pain.  Many NSAIDs have some aspirin like properties and might increase the bruising so I discouraged use of NSAIDs.  Encouraged RICE therapy with handout given.  Encouraged soft, elastic wrist support from pharmacy.  Educated about corneal abrasion.  Erythromycin ointment, fourth of an inch, twice daily for 5 to 7 days.  Follow-up with ophthalmology if eye pain persists.  Follow-up here if symptoms do not improve, worsen or new symptoms occur.

## 2023-10-23 ENCOUNTER — Encounter (HOSPITAL_BASED_OUTPATIENT_CLINIC_OR_DEPARTMENT_OTHER): Payer: Self-pay | Admitting: Family Medicine

## 2023-10-23 ENCOUNTER — Telehealth (HOSPITAL_BASED_OUTPATIENT_CLINIC_OR_DEPARTMENT_OTHER): Payer: Self-pay | Admitting: Family Medicine

## 2023-10-23 NOTE — Telephone Encounter (Signed)
 Na

## 2023-10-23 NOTE — Telephone Encounter (Signed)
 Right Wrist IMPRESSION:  Subtle bony irregularity along the dorsal margin of the wrist may represent a small triquetral fracture. Correlate with point tenderness.  Facial Bones X-Ray IMPRESSION:  Subtle bony irregularity along the dorsal margin of the wrist may represent a small triquetral fracture. Correlate with point tenderness.  Patient updated.  She will contact her Orthopedist.

## 2024-09-02 ENCOUNTER — Ambulatory Visit (HOSPITAL_BASED_OUTPATIENT_CLINIC_OR_DEPARTMENT_OTHER)
Admission: EM | Admit: 2024-09-02 | Discharge: 2024-09-02 | Disposition: A | Discharge status: Home / Self Care | Attending: Family Medicine | Admitting: Family Medicine

## 2024-09-02 ENCOUNTER — Encounter (HOSPITAL_BASED_OUTPATIENT_CLINIC_OR_DEPARTMENT_OTHER): Payer: Self-pay

## 2024-09-02 DIAGNOSIS — J019 Acute sinusitis, unspecified: Secondary | ICD-10-CM | POA: Diagnosis not present

## 2024-09-02 MED ORDER — AMOXICILLIN 875 MG PO TABS: 875.0000 mg | ORAL_TABLET | Freq: Two times a day (BID) | ORAL | 0 refills | Status: AC

## 2024-09-02 NOTE — Discharge Instructions (Addendum)
 Treating you for a sinus infection.  Take the antibiotics as prescribed.  You can continue over-the-counter medicines for symptoms as needed.  Follow-up as needed

## 2024-09-02 NOTE — ED Provider Notes (Signed)
 " [facility]    CSN: [id number] Arrival date & time: 09/02/24  9188      History   Chief Complaint No chief complaint on file.   HPI Jacqueline Ramirez is a 69 y.o. female.   Pt is a 69 year old female that presents with  cough and congestion since 12/25. PT denies any fever,nausea or vomiting. She has been taking mucinex and anti histamine with some relief. The symptoms always return. Some fatigue and chills.       Past Medical History:  Diagnosis Date   Anxiety    Glaucoma     There are no active problems to display for this patient.   History reviewed. No pertinent surgical history.  OB History   No obstetric history on file.      Home Medications    Prior to Admission medications  Medication Sig Start Date End Date Taking? Authorizing Provider  amoxicillin  (AMOXIL ) 875 MG tablet Take 1 tablet (875 mg total) by mouth 2 (two) times daily for 7 days. 09/02/24 09/09/24 Yes Adah Corning A, FNP  erythromycin  ophthalmic ointment Place a 1/4 inch ribbon of ointment into the lower eyelid twice daily x 5-7 days 10/21/23  Yes Ival Domino, FNP  escitalopram (LEXAPRO) 10 MG tablet Take 10 mg by mouth daily.   Yes [provider]  methocarbamol  (ROBAXIN ) 500 MG tablet Take 2 tablets (1,000 mg total) by mouth 4 (four) times daily. 03/08/14  Yes Desiderio Chew, PA-C  latanoprost (XALATAN) 0.005 % ophthalmic solution Place 1 drop into both eyes at bedtime.    [provider]    Family History History reviewed. No pertinent family history.  Social History Social History[1]   Allergies   Benadryl [diphenhydramine hcl (sleep)] and Other   Review of Systems Review of Systems  See HPI Physical Exam Triage Vital Signs ED Triage Vitals  Encounter Vitals Group     BP 09/02/24 0830 125/83     Girls Systolic BP Percentile --      Girls Diastolic BP Percentile --      Boys Systolic BP Percentile --      Boys Diastolic BP Percentile --       Pulse Rate 09/02/24 0830 97     Resp 09/02/24 0830 18     Temp 09/02/24 0830 98.3 F (36.8 C)     Temp Source 09/02/24 0830 Oral     SpO2 09/02/24 0830 97 %     Weight --      Height --      Head Circumference --      Peak Flow --      Pain Score 09/02/24 0832 0     Pain Loc --      Pain Education --      Exclude from Growth Chart --    No data found.  Updated Vital Signs BP 125/83 (BP Location: Left Arm)   Pulse 97   Temp 98.3 F (36.8 C) (Oral)   Resp 18   SpO2 97%   Visual Acuity Right Eye Distance:   Left Eye Distance:   Bilateral Distance:    Right Eye Near:   Left Eye Near:    Bilateral Near:     Physical Exam Constitutional:      General: She is not in acute distress.    Appearance: Normal appearance. She is not ill-appearing, toxic-appearing or diaphoretic.  HENT:     Head: Normocephalic and atraumatic.  Right Ear: Tympanic membrane and ear canal normal.     Left Ear: Tympanic membrane and ear canal normal.     Nose: Congestion present.     Mouth/Throat:     Pharynx: Oropharynx is clear.  Eyes:     Conjunctiva/sclera: Conjunctivae normal.  Cardiovascular:     Rate and Rhythm: Normal rate and regular rhythm.     Pulses: Normal pulses.     Heart sounds: Normal heart sounds.  Pulmonary:     Effort: Pulmonary effort is normal.     Breath sounds: Normal breath sounds.  Skin:    General: Skin is warm and dry.  Neurological:     Mental Status: She is alert.  Psychiatric:        Mood and Affect: Mood normal.      UC Treatments / Results  Labs (all labs ordered are listed, but only abnormal results are displayed) Labs Reviewed - No data to display  EKG   Radiology No results found.  Procedures Procedures (including critical care time)  Medications Ordered in UC Medications - No data to display  Initial Impression / Assessment and Plan / UC Course  I have reviewed the triage vital signs and the nursing notes.  Pertinent labs &  imaging results that were available during my care of the patient were reviewed by me and considered in my medical decision making (see chart for details).     Sinusitis- Treating for a sinus infection. Symptoms x 2 weeks.  Take the antibiotics as prescribed.  You can continue over-the-counter medicines for symptoms as needed.  Follow-up as needed Final Clinical Impressions(s) / UC Diagnoses   Final diagnoses:  Acute non-recurrent sinusitis, unspecified location     Discharge Instructions      Treating you for a sinus infection.  Take the antibiotics as prescribed.  You can continue over-the-counter medicines for symptoms as needed.  Follow-up as needed     ED Prescriptions     Medication Sig Dispense Auth. Provider   amoxicillin  (AMOXIL ) 875 MG tablet Take 1 tablet (875 mg total) by mouth 2 (two) times daily for 7 days. 14 tablet Adah Wilbert LABOR, FNP      PDMP not reviewed this encounter.     [1]  Social History Tobacco Use   Smoking status: Never  Substance Use Topics   Alcohol use: No   Drug use: No     Adah Wilbert LABOR, FNP 09/02/24 0847  "

## 2024-09-02 NOTE — ED Triage Notes (Signed)
 PT presents to the office for cough and congestion since 12/25. PT denies any fever,nausea or vomiting.
